# Patient Record
Sex: Female | Born: 1960
Health system: Southern US, Community
[De-identification: ages and names within clinical notes are randomized; demographics above are authoritative.]

## PROBLEM LIST (undated history)

## (undated) DIAGNOSIS — F32A Depression, unspecified: Secondary | ICD-10-CM

## (undated) DIAGNOSIS — F329 Major depressive disorder, single episode, unspecified: Secondary | ICD-10-CM

## (undated) DIAGNOSIS — F419 Anxiety disorder, unspecified: Secondary | ICD-10-CM

## (undated) DIAGNOSIS — E669 Obesity, unspecified: Secondary | ICD-10-CM

## (undated) HISTORY — DX: Obesity, unspecified: E66.9

## (undated) HISTORY — DX: Anxiety disorder, unspecified: F41.9

## (undated) HISTORY — DX: Major depressive disorder, single episode, unspecified: F32.9

## (undated) HISTORY — DX: Depression, unspecified: F32.A

---

## 1998-04-10 ENCOUNTER — Emergency Department (HOSPITAL_COMMUNITY): Admission: EM | Admit: 1998-04-10 | Discharge: 1998-04-10 | Payer: Self-pay | Admitting: Emergency Medicine

## 1998-04-14 ENCOUNTER — Ambulatory Visit (HOSPITAL_COMMUNITY): Admission: RE | Admit: 1998-04-14 | Discharge: 1998-04-14 | Payer: Self-pay | Admitting: *Deleted

## 1998-04-17 ENCOUNTER — Ambulatory Visit (HOSPITAL_COMMUNITY): Admission: RE | Admit: 1998-04-17 | Discharge: 1998-04-17 | Payer: Self-pay | Admitting: *Deleted

## 1999-09-24 ENCOUNTER — Ambulatory Visit (HOSPITAL_COMMUNITY): Admission: RE | Admit: 1999-09-24 | Discharge: 1999-09-24 | Payer: Self-pay | Admitting: Ophthalmology

## 1999-09-24 ENCOUNTER — Encounter: Payer: Self-pay | Admitting: Ophthalmology

## 1999-09-28 ENCOUNTER — Encounter (HOSPITAL_COMMUNITY): Admission: RE | Admit: 1999-09-28 | Discharge: 1999-12-27 | Payer: Self-pay | Admitting: *Deleted

## 2008-03-11 ENCOUNTER — Ambulatory Visit: Payer: Self-pay

## 2009-05-15 ENCOUNTER — Ambulatory Visit: Payer: Self-pay | Admitting: Unknown Physician Specialty

## 2010-07-06 ENCOUNTER — Ambulatory Visit: Payer: Self-pay

## 2011-10-31 ENCOUNTER — Other Ambulatory Visit: Payer: Self-pay | Admitting: Family Medicine

## 2011-10-31 ENCOUNTER — Ambulatory Visit
Admission: RE | Admit: 2011-10-31 | Discharge: 2011-10-31 | Disposition: A | Discharge status: Home / Self Care | Payer: 59 | Source: Ambulatory Visit | Attending: Family Medicine | Admitting: Family Medicine

## 2011-10-31 DIAGNOSIS — T148XXA Other injury of unspecified body region, initial encounter: Secondary | ICD-10-CM

## 2012-08-08 ENCOUNTER — Ambulatory Visit: Payer: Self-pay | Admitting: Obstetrics and Gynecology

## 2012-08-17 ENCOUNTER — Ambulatory Visit: Payer: Self-pay | Admitting: Neurology

## 2014-07-02 ENCOUNTER — Ambulatory Visit: Payer: BC Managed Care – PPO | Admitting: Dietician

## 2014-08-11 ENCOUNTER — Ambulatory Visit: Payer: BC Managed Care – PPO | Admitting: Skilled Nursing Facility1

## 2014-09-09 ENCOUNTER — Encounter: Payer: BLUE CROSS/BLUE SHIELD | Attending: Family Medicine | Admitting: Dietician

## 2014-09-09 ENCOUNTER — Encounter: Payer: Self-pay | Admitting: Dietician

## 2014-09-09 VITALS — Ht 69.0 in | Wt 270.0 lb

## 2014-09-09 DIAGNOSIS — Z6839 Body mass index (BMI) 39.0-39.9, adult: Secondary | ICD-10-CM | POA: Diagnosis not present

## 2014-09-09 DIAGNOSIS — E669 Obesity, unspecified: Secondary | ICD-10-CM | POA: Insufficient documentation

## 2014-09-09 DIAGNOSIS — Z713 Dietary counseling and surveillance: Secondary | ICD-10-CM | POA: Insufficient documentation

## 2014-09-09 NOTE — Progress Notes (Signed)
  Medical Nutrition Therapy:  Appt start time: 0945 end time:  1100.   Assessment:  Primary concerns today: weight control, portion control, need to increase exercise.  Misdiagnosed with MS in 1999.  Working to come out of depression and anger from this.  [place].  Partner and patient are "foodies".  Grow and cook their food.  Both share shopping and cooking.  Shops at [employer] once a month and grocery store 1-2 times per month and supplements with garden.  Prefers to avoid processed food.  175-200 lbs "best weight" but "it has been a long time".  200 lbs in 2001 with the Atkins diet.  230 lbs until 2-3 years ago.  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  Christiana Pellantats out rarely [place].  States that she monitors portion size at times but that this is very difficult.  Preferred Learning Style:   Auditory  Visual  Hands on  Learning Readiness:   Ready  MEDICATIONS: Vitamin D   DIETARY INTAKE:  24-hr recall:  B (11 AM): Coffee, 1 cup with hot chocolate and half and half, everything bagel with butter Snk ( AM): none  L ( PM): pasta and salad Snk ( PM): none D (6 PM): Mashed potatoes, salad with steak. Snk ( PM): popcorn occasionally Beverages: green tea with (1 cup sugar /2 gallons), coffee with hot chocolate and half and half  Usual physical activity: has treadmill but doesn't use, takes stairs, walks [place] farm, [employer] [place]  Estimated energy needs: 1400 calories 158 g carbohydrates 105 g protein 39 g fat  Progress Towards Goal(s):  In progress.   Nutritional Diagnosis:  [place]-3.3 Overweight/obesity As related to portion control.  As evidenced by 39.9.    Intervention:  Nutrition Counseling regarding weight control, portion sizes, need to increase exercise.  2-3 Carbohydrate choices at eat meal Consider adding breakfast 0-1 Carbohydrate choice for snack if hungry Think about portion  control Consider reading "Mindless Eating" book Aim for 30 minutes exercise daily 5 x per week and increase as able.  Teaching Method Utilized:  Visual Auditory Hands on  Handouts given during visit include:  My Plate  Low CHO snacks  Label reading  Eat right web site  Barriers to learning/adherence to lifestyle change: Stress  Demonstrated degree of understanding via:  Teach Back   Monitoring/Evaluation:  Dietary intake, exercise, and body weight prn.

## 2014-09-09 NOTE — Patient Instructions (Addendum)
2-3 Carbohydrate choices at eat meal Consider adding breakfast 0-1 Carbohydrate choice for snack if hungry Think about portion control Consider reading "Mindless Eating" book Aim for 30 minutes exercise daily 5 x per week and increase as able.

## 2014-09-09 NOTE — Addendum Note (Signed)
Addended by: Bonnita LevanJOBE, Keilana Morlock L on: 09/09/2014 02:31 PM   Modules accepted: Medications

## 2015-04-29 ENCOUNTER — Other Ambulatory Visit: Payer: Self-pay | Admitting: Family Medicine

## 2015-04-29 ENCOUNTER — Other Ambulatory Visit: Payer: Self-pay | Admitting: Obstetrics and Gynecology

## 2015-04-29 DIAGNOSIS — Z1231 Encounter for screening mammogram for malignant neoplasm of breast: Secondary | ICD-10-CM

## 2015-05-06 ENCOUNTER — Ambulatory Visit
Admission: RE | Admit: 2015-05-06 | Discharge: 2015-05-06 | Disposition: A | Discharge status: Home / Self Care | Payer: BLUE CROSS/BLUE SHIELD | Source: Ambulatory Visit | Attending: Family Medicine | Admitting: Family Medicine

## 2015-05-06 DIAGNOSIS — Z1231 Encounter for screening mammogram for malignant neoplasm of breast: Secondary | ICD-10-CM | POA: Diagnosis not present

## 2016-05-05 ENCOUNTER — Other Ambulatory Visit: Payer: Self-pay | Admitting: Family Medicine

## 2016-05-26 ENCOUNTER — Other Ambulatory Visit: Payer: Self-pay | Admitting: Family Medicine

## 2016-05-26 DIAGNOSIS — Z1231 Encounter for screening mammogram for malignant neoplasm of breast: Secondary | ICD-10-CM

## 2016-06-02 ENCOUNTER — Ambulatory Visit
Admission: RE | Admit: 2016-06-02 | Discharge: 2016-06-02 | Disposition: A | Discharge status: Home / Self Care | Payer: BLUE CROSS/BLUE SHIELD | Source: Ambulatory Visit | Attending: Family Medicine | Admitting: Family Medicine

## 2016-06-02 DIAGNOSIS — Z1231 Encounter for screening mammogram for malignant neoplasm of breast: Secondary | ICD-10-CM

## 2017-03-16 ENCOUNTER — Other Ambulatory Visit (HOSPITAL_COMMUNITY)
Admission: RE | Admit: 2017-03-16 | Discharge: 2017-03-16 | Disposition: A | Discharge status: Home / Self Care | Payer: BLUE CROSS/BLUE SHIELD | Source: Ambulatory Visit | Attending: Family Medicine | Admitting: Family Medicine

## 2017-03-16 ENCOUNTER — Other Ambulatory Visit: Payer: Self-pay | Admitting: Family Medicine

## 2017-03-16 DIAGNOSIS — Z01411 Encounter for gynecological examination (general) (routine) with abnormal findings: Secondary | ICD-10-CM | POA: Diagnosis present

## 2017-03-20 LAB — CYTOLOGY - PAP
Diagnosis: NEGATIVE
HPV: NOT DETECTED

## 2017-05-10 ENCOUNTER — Other Ambulatory Visit: Payer: Self-pay | Admitting: Family Medicine

## 2017-05-10 DIAGNOSIS — Z1231 Encounter for screening mammogram for malignant neoplasm of breast: Secondary | ICD-10-CM

## 2017-05-15 ENCOUNTER — Other Ambulatory Visit: Payer: Self-pay | Admitting: Family Medicine

## 2017-05-15 DIAGNOSIS — E2839 Other primary ovarian failure: Secondary | ICD-10-CM

## 2017-07-04 ENCOUNTER — Ambulatory Visit
Admission: RE | Admit: 2017-07-04 | Discharge: 2017-07-04 | Disposition: A | Discharge status: Home / Self Care | Payer: BLUE CROSS/BLUE SHIELD | Source: Ambulatory Visit | Attending: Family Medicine | Admitting: Family Medicine

## 2017-07-04 DIAGNOSIS — M85852 Other specified disorders of bone density and structure, left thigh: Secondary | ICD-10-CM | POA: Diagnosis not present

## 2017-07-04 DIAGNOSIS — Z1231 Encounter for screening mammogram for malignant neoplasm of breast: Secondary | ICD-10-CM | POA: Insufficient documentation

## 2017-07-04 DIAGNOSIS — Z780 Asymptomatic menopausal state: Secondary | ICD-10-CM | POA: Diagnosis not present

## 2017-07-04 DIAGNOSIS — E2839 Other primary ovarian failure: Secondary | ICD-10-CM | POA: Insufficient documentation

## 2018-06-19 ENCOUNTER — Other Ambulatory Visit: Payer: Self-pay | Admitting: Family Medicine

## 2018-06-19 DIAGNOSIS — Z1231 Encounter for screening mammogram for malignant neoplasm of breast: Secondary | ICD-10-CM

## 2018-07-05 ENCOUNTER — Ambulatory Visit
Admission: RE | Admit: 2018-07-05 | Discharge: 2018-07-05 | Disposition: A | Discharge status: Home / Self Care | Payer: BLUE CROSS/BLUE SHIELD | Source: Ambulatory Visit | Attending: Family Medicine | Admitting: Family Medicine

## 2018-07-05 DIAGNOSIS — Z1231 Encounter for screening mammogram for malignant neoplasm of breast: Secondary | ICD-10-CM

## 2019-07-03 ENCOUNTER — Other Ambulatory Visit: Payer: Self-pay | Admitting: Family Medicine

## 2019-07-03 DIAGNOSIS — Z1231 Encounter for screening mammogram for malignant neoplasm of breast: Secondary | ICD-10-CM

## 2019-10-01 ENCOUNTER — Ambulatory Visit
Admission: RE | Admit: 2019-10-01 | Discharge: 2019-10-01 | Disposition: A | Discharge status: Home / Self Care | Payer: 59 | Source: Ambulatory Visit | Attending: Family Medicine | Admitting: Family Medicine

## 2019-10-01 DIAGNOSIS — Z1231 Encounter for screening mammogram for malignant neoplasm of breast: Secondary | ICD-10-CM | POA: Insufficient documentation

## 2020-09-11 ENCOUNTER — Other Ambulatory Visit: Payer: Self-pay | Admitting: Family Medicine

## 2020-11-10 ENCOUNTER — Other Ambulatory Visit: Payer: Self-pay | Admitting: Family Medicine

## 2020-11-10 DIAGNOSIS — Z1231 Encounter for screening mammogram for malignant neoplasm of breast: Secondary | ICD-10-CM

## 2020-11-27 ENCOUNTER — Ambulatory Visit
Admission: RE | Admit: 2020-11-27 | Discharge: 2020-11-27 | Disposition: A | Discharge status: Home / Self Care | Payer: BLUE CROSS/BLUE SHIELD | Source: Ambulatory Visit | Attending: Family Medicine | Admitting: Family Medicine

## 2020-11-27 ENCOUNTER — Other Ambulatory Visit: Payer: Self-pay

## 2020-11-27 DIAGNOSIS — Z1231 Encounter for screening mammogram for malignant neoplasm of breast: Secondary | ICD-10-CM | POA: Diagnosis present

## 2021-02-08 ENCOUNTER — Other Ambulatory Visit (HOSPITAL_COMMUNITY)
Admission: RE | Admit: 2021-02-08 | Discharge: 2021-02-08 | Disposition: A | Discharge status: Home / Self Care | Payer: BLUE CROSS/BLUE SHIELD | Source: Ambulatory Visit | Attending: Family Medicine | Admitting: Family Medicine

## 2021-02-08 DIAGNOSIS — Z01411 Encounter for gynecological examination (general) (routine) with abnormal findings: Secondary | ICD-10-CM | POA: Insufficient documentation

## 2021-02-10 LAB — CYTOLOGY - PAP: Diagnosis: NEGATIVE

## 2022-07-13 IMAGING — MG MM DIGITAL SCREENING BILAT W/ TOMO AND CAD
8 series · 8 of 24 positions shown · non-contrast
Comparison: Previous exam(s).

CLINICAL DATA: Screening.

EXAM:
DIGITAL SCREENING BILATERAL MAMMOGRAM WITH TOMOSYNTHESIS AND CAD
TECHNIQUE: Bilateral screening digital craniocaudal and mediolateral oblique
mammograms were obtained. Bilateral screening digital breast
tomosynthesis was performed. The images were evaluated with
computer-aided detection.

[R MLO synth-2D]
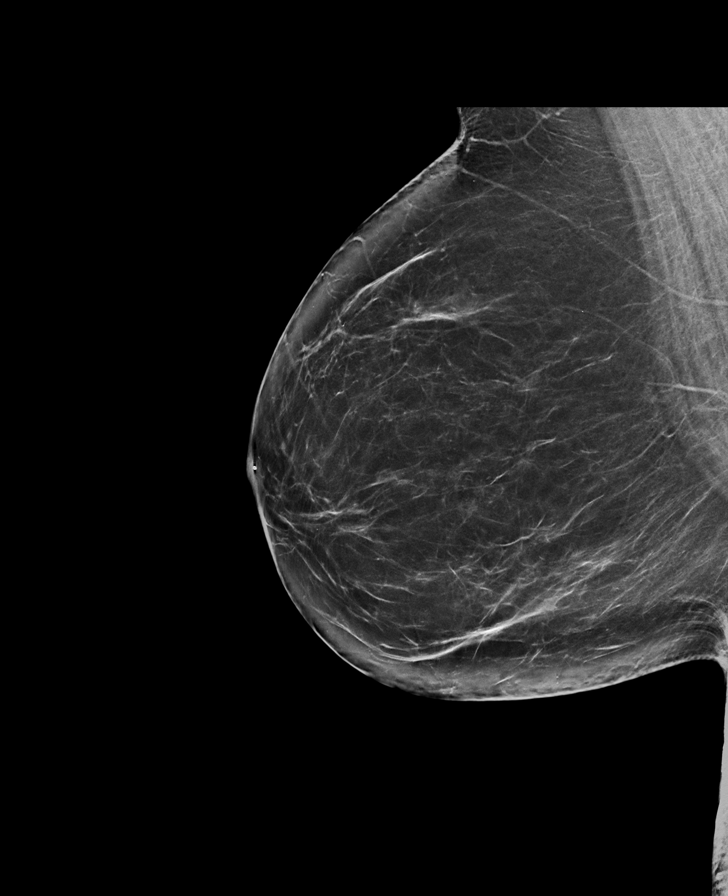

[L CC synth-2D]
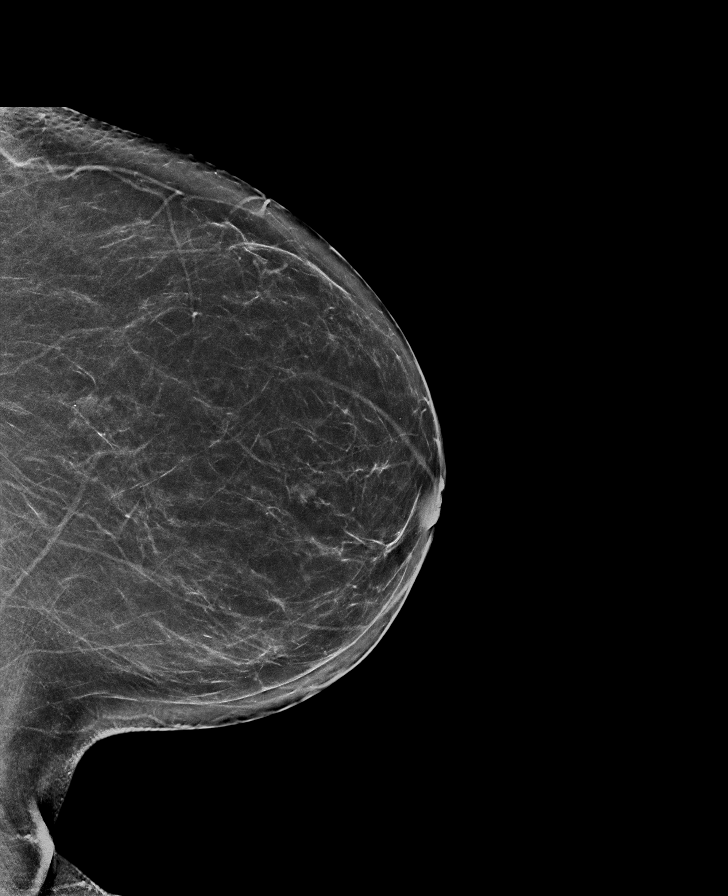

[L MLO synth-2D]
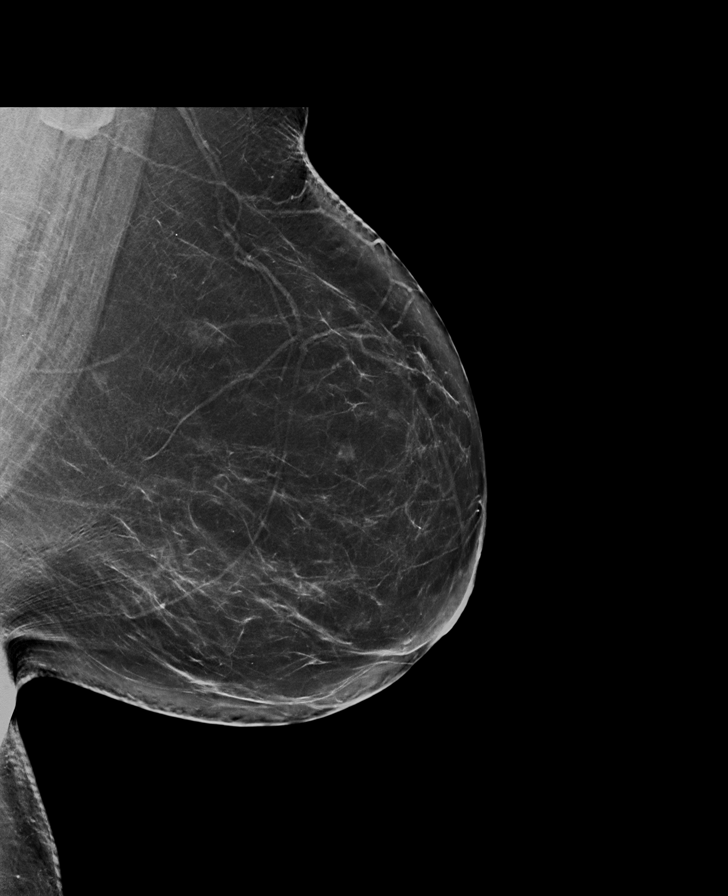

[R CC synth-2D]
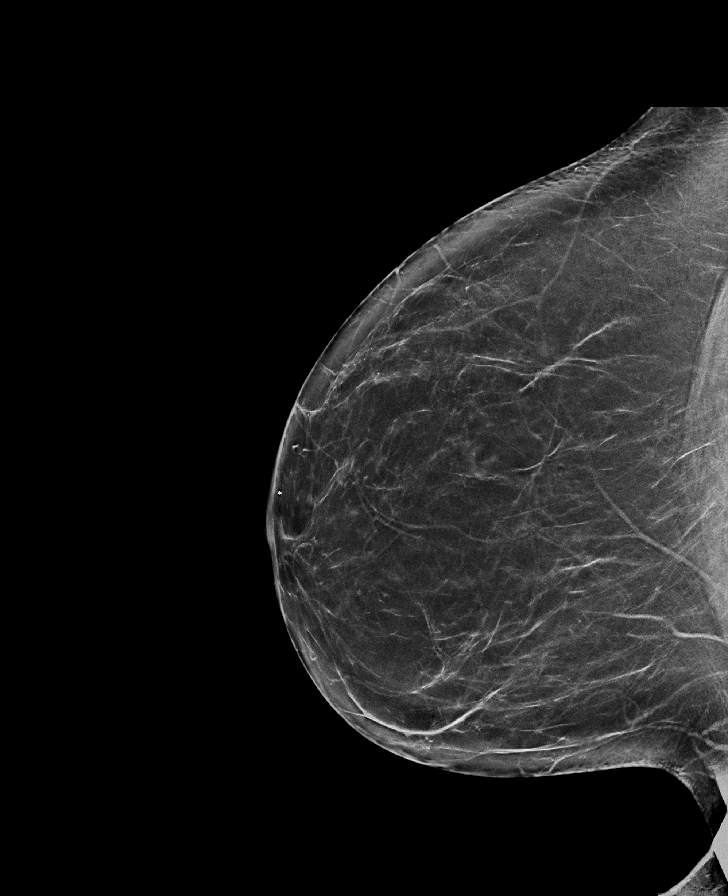

[R CC tomo · tomo slice 37/74.0]
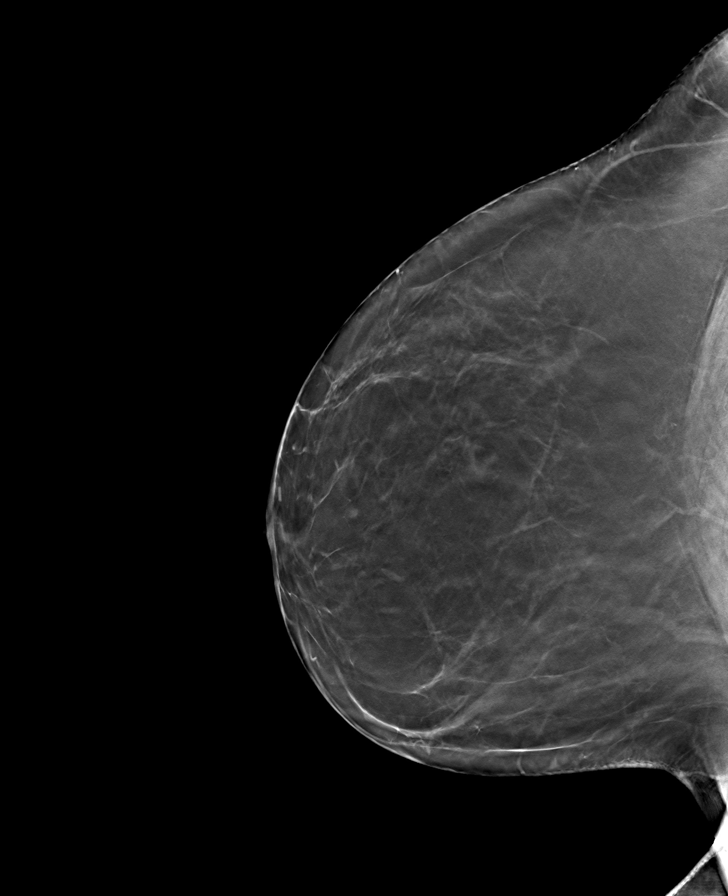

[L MLO tomo · tomo slice 43/84.0]
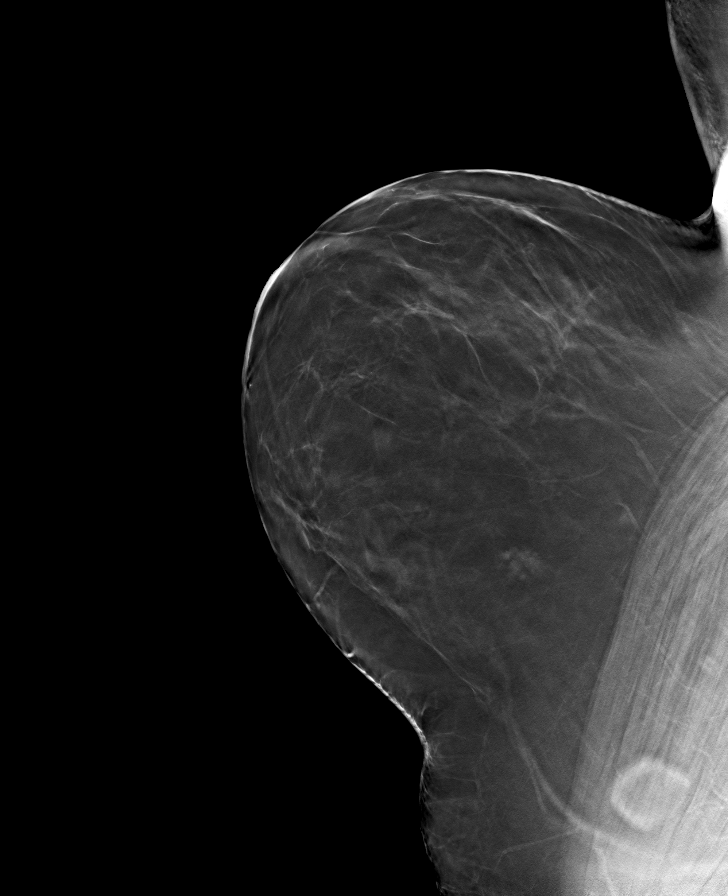

[L CC tomo · tomo slice 35/70.0]
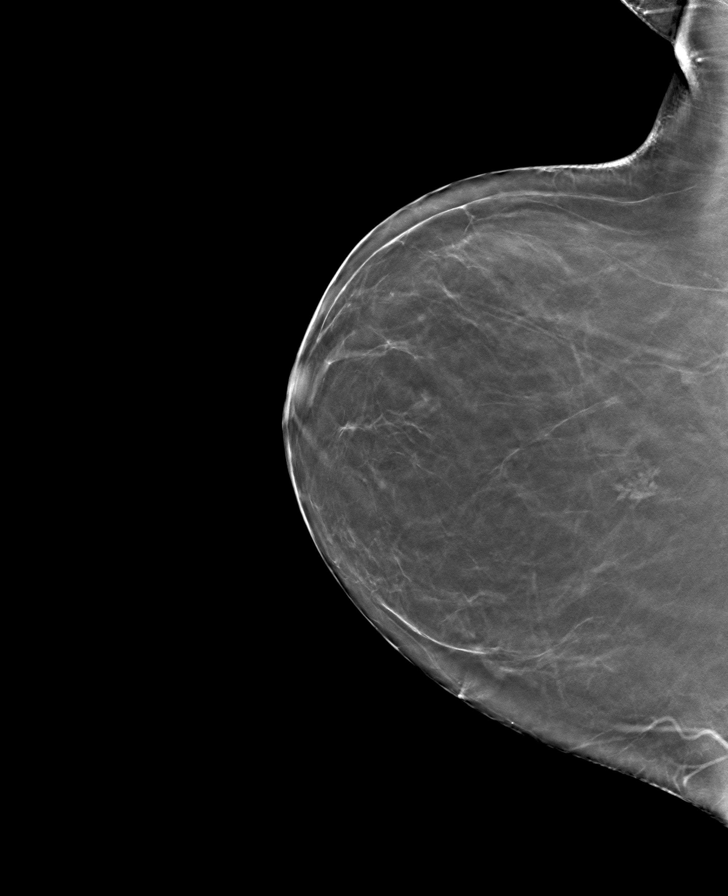

[R MLO tomo · tomo slice 39/78.0]
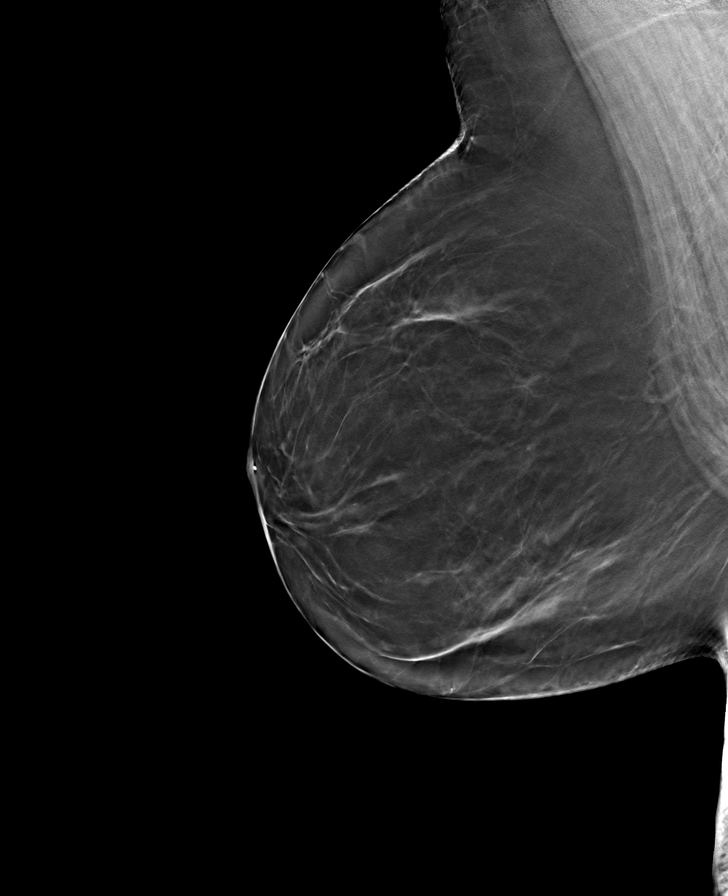

[8 of 24 positions shown; findings below may reference images not displayed]

ACR Breast Density Category b: There are scattered areas of
fibroglandular density.
FINDINGS: There are no findings suspicious for malignancy. The images were
evaluated with computer-aided detection.
IMPRESSION: No mammographic evidence of malignancy. A result letter of this
screening mammogram will be mailed directly to the patient.

RECOMMENDATION:
Screening mammogram in one year. (Code:[id number])

BI-RADS CATEGORY  1: Negative.

## 2023-06-30 ENCOUNTER — Other Ambulatory Visit: Payer: Self-pay | Admitting: Family Medicine

## 2023-06-30 DIAGNOSIS — Z1231 Encounter for screening mammogram for malignant neoplasm of breast: Secondary | ICD-10-CM

## 2023-07-12 ENCOUNTER — Ambulatory Visit
Admission: RE | Admit: 2023-07-12 | Discharge: 2023-07-12 | Disposition: A | Discharge status: Home / Self Care | Payer: BLUE CROSS/BLUE SHIELD | Source: Ambulatory Visit | Attending: Family Medicine | Admitting: Family Medicine

## 2023-07-12 DIAGNOSIS — Z1231 Encounter for screening mammogram for malignant neoplasm of breast: Secondary | ICD-10-CM | POA: Diagnosis present
